# Patient Record
Sex: Female | Born: 2005 | Race: White | Hispanic: No | Marital: Single | Smoking: Never smoker
Health system: Southern US, Community
[De-identification: ages and names within clinical notes are randomized; demographics above are authoritative.]

## PROBLEM LIST (undated history)

## (undated) DIAGNOSIS — M869 Osteomyelitis, unspecified: Secondary | ICD-10-CM

## (undated) HISTORY — DX: Osteomyelitis, unspecified: M86.9

## (undated) HISTORY — PX: WISDOM TOOTH EXTRACTION: SHX21

---
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
## 2024-11-25 NOTE — Progress Notes (Unsigned)
°  Cardiology Office Note:  .   Date:  11/25/2024  ID:  Jonette Shank, DOB [birth year], MRN [id number] PCP: Patient, No Pcp Per  [facility] Providers Cardiologist:  None   History of Present Illness: .   No chief complaint on file.   Izzah Pasqua is a 18 y.o. female with below history who presents for the evaluation of depression at the request of syncope/palpitations.  Discussed the use of AI scribe software for clinical note transcription with the patient, who gave verbal consent to proceed.  History of Present Illness              ***{There is no content from the last Narrative History section.}    ROS: All other ROS reviewed and negative. Pertinent positives noted in the HPI.     Studies Reviewed: SABRA       Physical Exam:   VS:  There were no vitals taken for this visit.   Wt Readings from Last 3 Encounters:  08/18/21 157 lb (71.2 kg) (91%, Z= 1.34)*  08/25/20 144 lb (65.3 kg) (87%, Z= 1.11)*  07/21/20 137 lb (62.1 kg) (82%, Z= 0.91)*   * Growth percentiles are based on CDC (Girls, 2-20 Years) data.    GEN: Well nourished, well developed in no acute distress NECK: No JVD; No carotid bruits CARDIAC: ***RRR, no murmurs, rubs, gallops RESPIRATORY:  Clear to auscultation without rales, wheezing or rhonchi  ABDOMEN: Soft, non-tender, non-distended EXTREMITIES:  No edema; No deformity  ASSESSMENT AND PLAN: .   Assessment and Plan                 {Are you ordering a CV Procedure (e.g. stress test, cath, DCCV, TEE, etc)?   Press F2        :[id number]}   Follow-up: No follow-ups on file.  Signed, Darryle DASEN. Barbaraann, MD, [facility]  [facility]  [address] [place], [place] [address] [phone]  4:24 PM

## 2024-12-07 ENCOUNTER — Ambulatory Visit

## 2024-12-07 ENCOUNTER — Ambulatory Visit: Attending: Cardiovascular Disease | Admitting: Cardiovascular Disease

## 2024-12-07 ENCOUNTER — Encounter: Payer: Self-pay | Admitting: Cardiovascular Disease

## 2024-12-07 VITALS — BP 126/80 | HR 71 | Ht 65.0 in | Wt 160.4 lb

## 2024-12-07 DIAGNOSIS — R002 Palpitations: Secondary | ICD-10-CM | POA: Diagnosis not present

## 2024-12-07 DIAGNOSIS — R55 Syncope and collapse: Secondary | ICD-10-CM | POA: Insufficient documentation

## 2024-12-07 LAB — BASIC METABOLIC PANEL WITH GFR
BUN/Creatinine Ratio: 12 (ref 9–23)
BUN: 10 mg/dL (ref 6–20)
CO2: 19 mmol/L — ABNORMAL LOW (ref 20–29)
Calcium: 9.9 mg/dL (ref 8.7–10.2)
Chloride: 105 mmol/L (ref 96–106)
Creatinine, Ser: 0.86 mg/dL (ref 0.57–1.00)
Glucose: 74 mg/dL (ref 70–99)
Potassium: 4.1 mmol/L (ref 3.5–5.2)
Sodium: 141 mmol/L (ref 134–144)
eGFR: 100 mL/min/1.73

## 2024-12-07 LAB — TSH: TSH: 2.94 u[IU]/mL (ref 0.450–4.500)

## 2024-12-07 NOTE — Progress Notes (Unsigned)
 Enrolled patient for a 7 day Zio XT monitor to be mailed to patients home.

## 2024-12-07 NOTE — Patient Instructions (Signed)
 Medication Instructions:  Your physician recommends that you continue on your current medications as directed. Please refer to the Current Medication list given to you today.  *If you need a refill on your cardiac medications before your next appointment, please call your pharmacy*  Lab Work: Today- TSH, BMET  If you have labs (blood work) drawn today and your tests are completely normal, you will receive your results only by: MyChart Message (if you have MyChart) OR A paper copy in the mail If you have any lab test that is abnormal or we need to change your treatment, we will call you to review the results.  Testing/Procedures: Teresa Gross- Long Term Monitor Instructions  Your physician has requested you wear a ZIO patch monitor for 7 days.  This is a single patch monitor. [employer] supplies one patch monitor per enrollment. Additional stickers are not available. Please do not apply patch if you will be having a Nuclear Stress Test,  Echocardiogram, Cardiac CT, MRI, or Chest Xray during the period you would be wearing the  monitor. The patch cannot be worn during these tests. You cannot remove and re-apply the  ZIO XT patch monitor.  Your ZIO patch monitor will be mailed 3 day USPS to your address on file. It may take 3-5 days  to receive your monitor after you have been enrolled.  Once you have received your monitor, please review the enclosed instructions. Your monitor  has already been registered assigning a specific monitor serial # to you.  Billing and Patient Assistance Program Information  We have supplied [employer] with any of your insurance information on file for billing purposes. [employer] offers a sliding scale Patient Assistance Program for patients that do not have  insurance, or whose insurance does not completely cover the cost of the ZIO monitor.  You must apply for the Patient Assistance Program to qualify for this discounted rate.  To apply, please call [employer] at  [phone], select option 4, select option 2, ask to apply for  Patient Assistance Program. Meredeth will ask your household income, and how many people  are in your household. They will quote your out-of-pocket cost based on that information.  [employer] will also be able to set up a 68-month, interest-free payment plan if needed.  Applying the monitor   Shave hair from upper left chest.  Hold abrader disc by orange tab. Rub abrader in 40 strokes over the upper left chest as  indicated in your monitor instructions.  Clean area with 4 enclosed alcohol pads. Let dry.  Apply patch as indicated in monitor instructions. Patch will be placed under collarbone on left  side of chest with arrow pointing upward.  Rub patch adhesive wings for 2 minutes. Remove white label marked 1. Remove the white  label marked 2. Rub patch adhesive wings for 2 additional minutes.  While looking in a mirror, press and release button in center of patch. A small green light will  flash 3-4 times. This will be your only indicator that the monitor has been turned on.  Do not shower for the first 24 hours. You may shower after the first 24 hours.  Press the button if you feel a symptom. You will hear a small click. Record Date, Time and  Symptom in the Patient Logbook.  When you are ready to remove the patch, follow instructions on the last 2 pages of Patient  Logbook. Stick patch monitor onto the last page of Patient Logbook.  Place Patient Logbook  in the blue and white box. Use locking tab on box and tape box closed  securely. The blue and white box has prepaid postage on it. Please place it in the mailbox as  soon as possible. Your physician should have your test results approximately 7 days after the  monitor has been mailed back to [facility].  Call [facility] Customer Care at [phone] if you have questions regarding  your ZIO XT patch monitor. Call them immediately if you see an orange light  blinking on your  monitor.  If your monitor falls off in less than 4 days, contact our Monitor department at [phone].  If your monitor becomes loose or falls off after 4 days call [employer] at [phone] for  suggestions on securing your monitor   Follow-Up: At [facility], you and your health needs are our priority.  As part of our continuing mission to provide you with exceptional heart care, our providers are all part of one team.  This team includes your primary Cardiologist (physician) and Advanced Practice Providers or APPs (Physician Assistants and Nurse Practitioners) who all work together to provide you with the care you need, when you need it.  Your next appointment:   We will see you on an as needed basis.   Provider:   Darryle DASEN. Barbaraann, MD

## 2024-12-09 ENCOUNTER — Ambulatory Visit: Payer: Self-pay | Admitting: Cardiovascular Disease

## 2024-12-24 DIAGNOSIS — R002 Palpitations: Secondary | ICD-10-CM
